# Patient Record
Sex: Male | Born: 1990 | Race: White | Hispanic: No | Marital: Single | Smoking: Former smoker
Health system: Southern US, Community
[De-identification: ages and names within clinical notes are randomized; demographics above are authoritative.]

## PROBLEM LIST (undated history)

## (undated) DIAGNOSIS — S129XXA Fracture of neck, unspecified, initial encounter: Secondary | ICD-10-CM

## (undated) DIAGNOSIS — I422 Other hypertrophic cardiomyopathy: Secondary | ICD-10-CM

## (undated) HISTORY — PX: OTHER SURGICAL HISTORY: SHX169

---
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
## 2010-10-10 LAB — POCT I-STAT CREATININE
Creatinine, Ser: 0.9
Operator id: 288831

## 2010-10-10 LAB — TSH: TSH: 140.408 — ABNORMAL HIGH

## 2010-10-10 LAB — URINALYSIS, ROUTINE W REFLEX MICROSCOPIC
Bilirubin Urine: NEGATIVE
Glucose, UA: NEGATIVE
Hgb urine dipstick: NEGATIVE
Protein, ur: NEGATIVE
Urobilinogen, UA: 0.2

## 2010-10-10 LAB — BASIC METABOLIC PANEL
CO2: 25
CO2: 27
Calcium: 9
Calcium: 9.5
Chloride: 104
Creatinine, Ser: 0.66
Creatinine, Ser: 0.7
Glucose, Bld: 132 — ABNORMAL HIGH
Glucose, Bld: 93
Sodium: 137
Sodium: 138

## 2010-10-10 LAB — I-STAT 8, (EC8 V) (CONVERTED LAB)
Bicarbonate: 22.2
HCT: 40
Hemoglobin: 13.6
Operator id: 288831
Sodium: 138
TCO2: 23
pCO2, Ven: 36.4 — ABNORMAL LOW

## 2010-10-10 LAB — CBC
Hemoglobin: 11.8 — ABNORMAL LOW
Hemoglobin: 12.9
MCHC: 34.3
MCHC: 34.5
MCHC: 34.6
MCV: 81.5
Platelets: 309
RBC: 4.24
RBC: 4.62
RDW: 14.3
RDW: 14.3
WBC: 26 — ABNORMAL HIGH

## 2010-10-10 LAB — T3, FREE: T3, Free: 1.6 — ABNORMAL LOW (ref 2.3–4.2)

## 2010-10-10 LAB — T4, FREE: Free T4: 0.46 — ABNORMAL LOW

## 2010-10-10 LAB — PROTIME-INR
INR: 1
Prothrombin Time: 12.9

## 2014-07-14 ENCOUNTER — Emergency Department (HOSPITAL_COMMUNITY)
Admission: EM | Admit: 2014-07-14 | Discharge: 2014-07-14 | Disposition: A | Discharge status: Home / Self Care | Payer: Self-pay | Attending: Emergency Medicine | Admitting: Emergency Medicine

## 2014-07-14 ENCOUNTER — Encounter (HOSPITAL_COMMUNITY): Payer: Self-pay | Admitting: Emergency Medicine

## 2014-07-14 DIAGNOSIS — R51 Headache: Secondary | ICD-10-CM | POA: Insufficient documentation

## 2014-07-14 DIAGNOSIS — Z87891 Personal history of nicotine dependence: Secondary | ICD-10-CM | POA: Insufficient documentation

## 2014-07-14 DIAGNOSIS — R519 Headache, unspecified: Secondary | ICD-10-CM

## 2014-07-14 DIAGNOSIS — R011 Cardiac murmur, unspecified: Secondary | ICD-10-CM | POA: Insufficient documentation

## 2014-07-14 DIAGNOSIS — H538 Other visual disturbances: Secondary | ICD-10-CM | POA: Insufficient documentation

## 2014-07-14 DIAGNOSIS — Z8781 Personal history of (healed) traumatic fracture: Secondary | ICD-10-CM | POA: Insufficient documentation

## 2014-07-14 HISTORY — DX: Fracture of neck, unspecified, initial encounter: S12.9XXA

## 2014-07-14 MED ORDER — IBUPROFEN 800 MG PO TABS
800.0000 mg | ORAL_TABLET | Freq: Once | ORAL | Status: AC
  Administered 2014-07-14: 800 mg via ORAL
  Filled 2014-07-14: qty 1

## 2014-07-14 NOTE — ED Notes (Signed)
Pt reports that at approx 1230 today he started having "flashing like a strobe" in his R eye which passed but then he developed a headache. Pt denies dizziness or nausea at present. Nausea at initial onset of headache. No changes to speech, no weakness. No neuro deficits noted.

## 2014-07-14 NOTE — ED Provider Notes (Signed)
CSN: [id number]     Arrival date & time 07/14/14  1648 History   First MD Initiated Contact with Patient 07/14/14 1728     Chief Complaint  Patient presents with  . Headache    Patient is a 24 y.o. male presenting with headaches. The history is provided by the patient.  Headache Pain location:  Frontal Quality:  Dull Severity currently:  4/10 Onset quality:  Gradual Duration: several hours. Progression:  Improving Chronicity:  Recurrent Similar to prior headaches: yes   Relieved by:  Acetaminophen Worsened by:  Nothing Associated symptoms: blurred vision and visual change   Associated symptoms: no fever, no neck pain, no syncope, no vomiting and no weakness   Risk factors: no family hx of SAH   Pt reports earlier today he noticed a "flash of strobe of lights" in his vision and soon after this stopped he had onset of frontal HA No visual loss He reports blurred vision during that time now improved No diplopia No focal weakness No cp/sob No dizziness He is now improved No recent head trauma (reports distant h/o trauma due to [facility]) He reports similar episode 2 years ago of this similar HA but no evaluation performed  Past Medical History  Diagnosis Date  . Neck fracture    History reviewed. No pertinent past surgical history. History reviewed. No pertinent family history. History  Substance Use Topics  . Smoking status: Former Games developer  . Smokeless tobacco: Current User  . Alcohol Use: No    Review of Systems  Constitutional: Negative for fever.  Eyes: Positive for blurred vision.  Respiratory: Negative for shortness of breath.   Cardiovascular: Negative for chest pain and syncope.  Gastrointestinal: Negative for vomiting.  Musculoskeletal: Negative for neck pain.  Neurological: Positive for headaches. Negative for syncope, speech difficulty and weakness.  All other systems reviewed and are negative.     Allergies  Codeine  Home Medications   Prior to  Admission medications   Not on File   BP 145/74 mmHg  Pulse 89  Temp(Src) 97.8 F (36.6 C) (Oral)  Resp 16  Ht  (1.575 m)  Wt 217 lb (98.431 kg)  BMI 39.68 kg/m2  SpO2 96% Physical Exam CONSTITUTIONAL: Well developed/well nourished HEAD: Normocephalic/atraumatic EYES: EOMI/PERRL, no nystagmus, no ptosis, normal fundoscopic exam (no papilledema) ENMT: Mucous membranes moist NECK: supple no meningeal signs, no bruits SPINE/BACK:entire spine nontender CV: S1/S2 noted, 2/6 systolic ejection murmur LUNGS: Lungs are clear to auscultation bilaterally, no apparent distress ABDOMEN: soft, nontender, no rebound or guarding GU:no cva tenderness NEURO:Awake/alert, facies symmetric, no arm or leg drift is noted Equal 5/5 strength with shoulder abduction, elbow flex/extension, wrist flex/extension in upper extremities and equal hand grips bilaterally Equal 5/5 strength with hip flexion,knee flex/extension, foot dorsi/plantar flexion Cranial nerves 3/4/5/6/07/24/08/11/12 tested and intact Gait normal without ataxia No past pointing Sensation to light touch intact in all extremities EXTREMITIES: pulses normal, full ROM SKIN: warm, color normal PSYCH: no abnormalities of mood noted, alert and oriented to situation     ED Course  Procedures  6:01 PM Pt reports he is already improved  He is well appearing No neuro deficits I doubt SAH/CVA/carotid dissection at this time, doubt other acute neurologic event at this time  He has significant heart murmur.  He is unaware of this.  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

## 2014-07-14 NOTE — Discharge Instructions (Signed)

## 2016-09-11 ENCOUNTER — Encounter: Payer: Self-pay | Admitting: Cardiology

## 2016-09-11 ENCOUNTER — Ambulatory Visit (INDEPENDENT_AMBULATORY_CARE_PROVIDER_SITE_OTHER): Payer: Self-pay | Admitting: Cardiology

## 2016-09-11 VITALS — BP 124/64 | HR 72 | Resp 12 | Ht 64.0 in | Wt 217.0 lb

## 2016-09-11 DIAGNOSIS — R011 Cardiac murmur, unspecified: Secondary | ICD-10-CM

## 2016-09-11 NOTE — Progress Notes (Signed)
Cardiology Office Note:    Date:  09/11/2016   ID:  Tyler Walton, DOB [birth year], MRN [id number]  PCP:  Estanislado Pandy, MD  Cardiologist:  Garwin Brothers, MD   Referring MD: Estanislado Pandy, MD    ASSESSMENT:    No diagnosis found. PLAN:    In order of problems listed above:  1. I reassured the patient about my findings. He's had asymptomatic life all this time. I discussed him and found him that his never had an echocardiogram in the past. His EKG is abnormal. My first step is an echocardiogram to assess his cardiac anatomy. Further recommendations will be made based on the findings of the echocardiogram. Again he has had a strong family history of hypertrophic cardiomyopathy. His father has had a defibrillator and has undergone alcohol ablation for his hypertrophic cardiomyopathy.Patient will be seen in follow-up appointment in 1 months or earlier if the patient has any concerns.    Medication Adjustments/Labs and Tests Ordered: Current medicines are reviewed at length with the patient today.  Concerns regarding medicines are outlined above.  No orders of the defined types were placed in this encounter.  No orders of the defined types were placed in this encounter.    History of Present Illness:    Tyler Walton is a 26 y.o. male who is being seen today for the evaluation of Cardiac murmur at the request of Sasser, Clarene Critchley, MD. Patient is a pleasant 26 year old male accompanied by his family. He has no significant past medical history. He mentions to me that 2 years ago he was told by his doctor to have a cardiac murmur. He has a significant family history of hypertrophic cardiomyopathy. He denies any chest pain orthopnea or PND. He works as a Sales executive. At the time of my evaluation is alert awake oriented and in no distress. Is no history of palpitation dizziness or any passing out spells.  Past Medical History:  Diagnosis Date  . Neck fracture (HCC)     History  reviewed. No pertinent surgical history.  Current Medications: No outpatient prescriptions have been marked as taking for the 09/11/16 encounter (Office Visit) with Revankar, Aundra Dubin, MD.     Allergies:   Codeine   Social History   Social History  . Marital status: Single    Spouse name: N/A  . Number of children: N/A  . Years of education: N/A   Social History Main Topics  . Smoking status: Former Smoker    Start date: 2016  . Smokeless tobacco: Current User    Types: Snuff  . Alcohol use No  . Drug use: No  . Sexual activity: Not Asked   Other Topics Concern  . None   Social History Narrative  . None     Family History: The patient's family history includes Heart disease in his father.  ROS:   Please see the history of present illness.    All other systems reviewed and are negative.  EKGs/Labs/Other Studies Reviewed:    The following studies were reviewed today: I reviewed the patient's records from primary care office and discuss with him at extensive length. EKG was reviewed also.   Recent Labs: No results found for requested labs within last 8760 hours.  Recent Lipid Panel No results found for: CHOL, TRIG, HDL, CHOLHDL, VLDL, LDLCALC, LDLDIRECT  Physical Exam:    VS:  BP 124/64   Pulse 72   Resp 12   Ht 5\' 4"  (1.626  m)   Wt 217 lb (98.4 kg)   BMI 37.25 kg/m     Wt Readings from Last 3 Encounters:  09/11/16 217 lb (98.4 kg)  07/14/14 217 lb (98.4 kg)     GEN: Patient is in no acute distress HEENT: Normal NECK: No JVD; No carotid bruits LYMPHATICS: No lymphadenopathy CARDIAC: S1 S2 regular, 2/6 systolic murmur at the apex. RESPIRATORY:  Clear to auscultation without rales, wheezing or rhonchi  ABDOMEN: Soft, non-tender, non-distended MUSCULOSKELETAL:  No edema; No deformity  SKIN: Warm and dry NEUROLOGIC:  Alert and oriented x 3 PSYCHIATRIC:  Normal affect    Signed, Garwin Brothers, MD  09/11/2016 11:19 AM    [facility]

## 2016-09-11 NOTE — Patient Instructions (Signed)
Medication Instructions:  Your physician recommends that you continue on your current medications as directed. Please refer to the Current Medication list given to you today.   Labwork: None ordered  Testing/Procedures: Your physician has requested that you have an echocardiogram. Echocardiography is a painless test that uses sound waves to create images of your heart. It provides your doctor with information about the size and shape of your heart and how well your heart's chambers and valves are working. This procedure takes approximately one hour. There are no restrictions for this procedure.  Follow-Up: Your physician recommends that you schedule a follow-up appointment in: 1 month with Dr. Tomie China  Any Other Special Instructions Will Be Listed Below (If Applicable).  Please note that any paperwork needing to be filled out by the provider will need to be addressed at the front desk prior to seeing the provider. Please note that any paperwork FMLA, Disability or other documents regarding health condition is subject to a $25.00 charge that must be received prior to completion of paperwork in the form of a money order or check.     If you need a refill on your cardiac medications before your next appointment, please call your pharmacy.

## 2016-09-14 ENCOUNTER — Ambulatory Visit (HOSPITAL_BASED_OUTPATIENT_CLINIC_OR_DEPARTMENT_OTHER)
Admission: RE | Admit: 2016-09-14 | Discharge: 2016-09-14 | Disposition: A | Discharge status: Home / Self Care | Payer: Self-pay | Source: Ambulatory Visit | Attending: Cardiology | Admitting: Cardiology

## 2016-09-14 DIAGNOSIS — I340 Nonrheumatic mitral (valve) insufficiency: Secondary | ICD-10-CM | POA: Insufficient documentation

## 2016-09-14 DIAGNOSIS — R011 Cardiac murmur, unspecified: Secondary | ICD-10-CM

## 2016-09-14 DIAGNOSIS — I517 Cardiomegaly: Secondary | ICD-10-CM | POA: Insufficient documentation

## 2016-09-14 NOTE — Progress Notes (Signed)
  Echocardiogram 2D Echocardiogram has been performed.  Tyler Walton Tyler Walton 09/14/2016, 1:38 PM

## 2016-09-15 ENCOUNTER — Telehealth: Payer: Self-pay

## 2016-09-15 ENCOUNTER — Other Ambulatory Visit: Payer: Self-pay

## 2016-09-15 DIAGNOSIS — R931 Abnormal findings on diagnostic imaging of heart and coronary circulation: Secondary | ICD-10-CM

## 2016-09-15 NOTE — Telephone Encounter (Signed)
Patient informed of abnormal echo results. Patient advised that a referral to [facility] has been made for a cardiologist who specialized in hypertrophic cardiomyopathy. Patient agreed and verbalized understanding. Patient has been advised to stay out of work until further notice. Patient advised to come to the office to fill out medical records release form to have [facility] Patient is coming Tuesday morning 09/19/16 to pick up work note and fill out medical records release. No further questions.

## 2016-09-26 ENCOUNTER — Ambulatory Visit: Payer: Self-pay | Admitting: Cardiology

## 2016-10-12 ENCOUNTER — Ambulatory Visit: Payer: Self-pay | Admitting: Cardiology

## 2016-10-26 ENCOUNTER — Ambulatory Visit: Payer: Self-pay | Admitting: Cardiology

## 2018-03-24 ENCOUNTER — Emergency Department (HOSPITAL_COMMUNITY): Payer: Self-pay

## 2018-03-24 ENCOUNTER — Encounter (HOSPITAL_COMMUNITY): Payer: Self-pay | Admitting: Emergency Medicine

## 2018-03-24 ENCOUNTER — Emergency Department (HOSPITAL_COMMUNITY)
Admission: EM | Admit: 2018-03-24 | Discharge: 2018-03-24 | Disposition: A | Discharge status: Home / Self Care | Payer: Self-pay | Attending: Emergency Medicine | Admitting: Emergency Medicine

## 2018-03-24 ENCOUNTER — Other Ambulatory Visit: Payer: Self-pay

## 2018-03-24 DIAGNOSIS — Z87891 Personal history of nicotine dependence: Secondary | ICD-10-CM | POA: Insufficient documentation

## 2018-03-24 DIAGNOSIS — I493 Ventricular premature depolarization: Secondary | ICD-10-CM | POA: Insufficient documentation

## 2018-03-24 DIAGNOSIS — R002 Palpitations: Secondary | ICD-10-CM

## 2018-03-24 HISTORY — DX: Other hypertrophic cardiomyopathy: I42.2

## 2018-03-24 LAB — CBC WITH DIFFERENTIAL/PLATELET
ABS IMMATURE GRANULOCYTES: 0.02 10*3/uL (ref 0.00–0.07)
Basophils Absolute: 0 10*3/uL (ref 0.0–0.1)
Basophils Relative: 0 %
EOS ABS: 0.1 10*3/uL (ref 0.0–0.5)
EOS PCT: 2 %
HCT: 45.7 % (ref 39.0–52.0)
HEMOGLOBIN: 14.7 g/dL (ref 13.0–17.0)
Immature Granulocytes: 0 %
LYMPHS PCT: 17 %
Lymphs Abs: 1.6 10*3/uL (ref 0.7–4.0)
MCH: 27.2 pg (ref 26.0–34.0)
MCHC: 32.2 g/dL (ref 30.0–36.0)
MCV: 84.6 fL (ref 80.0–100.0)
Monocytes Absolute: 0.7 10*3/uL (ref 0.1–1.0)
Monocytes Relative: 7 %
NRBC: 0 % (ref 0.0–0.2)
Neutro Abs: 7.1 10*3/uL (ref 1.7–7.7)
Neutrophils Relative %: 74 %
Platelets: 268 10*3/uL (ref 150–400)
RBC: 5.4 MIL/uL (ref 4.22–5.81)
RDW: 12.8 % (ref 11.5–15.5)
WBC: 9.6 10*3/uL (ref 4.0–10.5)

## 2018-03-24 LAB — MAGNESIUM: MAGNESIUM: 2.1 mg/dL (ref 1.7–2.4)

## 2018-03-24 LAB — BASIC METABOLIC PANEL
ANION GAP: 8 (ref 5–15)
BUN: 15 mg/dL (ref 6–20)
CHLORIDE: 104 mmol/L (ref 98–111)
CO2: 26 mmol/L (ref 22–32)
CREATININE: 0.82 mg/dL (ref 0.61–1.24)
Calcium: 9.4 mg/dL (ref 8.9–10.3)
GFR calc non Af Amer: >60 mL/min (ref >60)
Glucose, Bld: 98 mg/dL (ref 70–99)
Potassium: 3.7 mmol/L (ref 3.5–5.1)
SODIUM: 138 mmol/L (ref 135–145)

## 2018-03-24 LAB — TROPONIN I
Troponin I: 0.03 ng/mL (ref <0.03)
Troponin I: 0.03 ng/mL (ref <0.03)

## 2018-03-24 NOTE — Discharge Instructions (Addendum)
Avoid avoid caffinated products, such as teas, colas, coffee, chocolate. Avoid over the counter cold medicines, herbal or "natural vitamin" products, and illicit drugs because they can contain stimulants.  Take your usual medications as previously directed. Call your regular Cardiologist tomorrow to schedule a follow up appointment this week.  Return to the Emergency Department immediately if worsening.

## 2018-03-24 NOTE — ED Triage Notes (Signed)
Patient c/o palpitations that started yesterday and has become more constant. Per patient feels palpitation approx every 5 minutes. Denies any chest pain or shortness of breath. 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

## 2018-03-24 NOTE — ED Provider Notes (Signed)
[facility] EMERGENCY DEPARTMENT Provider Note   CSN: [id number] Arrival date & time: 03/24/18  1501    History   Chief Complaint Chief Complaint  Patient presents with  . Palpitations    HPI Tyler Walton is a 28 y.o. male.      Palpitations  Pt was seen at 1530. Per pt, c/o gradual onset and worsening of persistent palpitations for the past 2 days. Pt describes the palpitations as "irregular beats" and are occurring "every 5 minutes." Endorses hx of hypertrophic cardiomyopathy, currently taking metoprolol daily. Denies CP, no SOB, no cough, no back pain, no abd pain, no fevers, no injury.      Past Medical History:  Diagnosis Date  . Hypertrophic cardiomyopathy (HCC)   . Neck fracture [facility])     Patient Active Problem List   Diagnosis Date Noted  . Murmur 09/11/2016    History reviewed. No pertinent surgical history.      Home Medications    Prior to Admission medications   Not on File    Family History Family History  Problem Relation Age of Onset  . Heart disease Father     Social History Social History   Tobacco Use  . Smoking status: Former Smoker    Types: Cigarettes    Start date: 2016  . Smokeless tobacco: Current User    Types: Snuff  Substance Use Topics  . Alcohol use: No  . Drug use: No     Allergies   Codeine   Review of Systems Review of Systems  Cardiovascular: Positive for palpitations.  ROS: Statement: All systems negative except as marked or noted in the HPI; Constitutional: Negative for fever and chills. ; ; Eyes: Negative for eye pain, redness and discharge. ; ; ENMT: Negative for ear pain, hoarseness, nasal congestion, sinus pressure and sore throat. ; ; Cardiovascular: +palpitations. Negative for chest pain, diaphoresis, dyspnea and peripheral edema. ; ; Respiratory: Negative for cough, wheezing and stridor. ; ; Gastrointestinal: Negative for nausea, vomiting, diarrhea, abdominal pain, blood in stool, hematemesis,  jaundice and rectal bleeding. . ; ; Genitourinary: Negative for dysuria, flank pain and hematuria. ; ; Musculoskeletal: Negative for back pain and neck pain. Negative for swelling and trauma.; ; Skin: Negative for pruritus, rash, abrasions, blisters, bruising and skin lesion.; ; Neuro: Negative for headache, lightheadedness and neck stiffness. Negative for weakness, altered level of consciousness, altered mental status, extremity weakness, paresthesias, involuntary movement, seizure and syncope.        Physical Exam Updated Vital Signs BP 132/74 (BP Location: Right Arm)   Pulse 68   Temp 97.7 F (36.5 C) (Oral)   Resp 18   Ht 5\' 4"  (1.626 m)   Wt 90.7 kg   SpO2 98%   BMI 34.33 kg/m   Patient Vitals for the past 24 hrs:  BP Temp Temp src Pulse Resp SpO2 Height Weight  03/24/18 2030 110/69 - - 60 16 97 % - -  03/24/18 1930 110/67 - - 67 15 96 % - -  03/24/18 1900 99/65 - - (!) 59 15 97 % - -  03/24/18 1830 102/64 - - 82 15 97 % - -  03/24/18 1800 (!) 111/49 - - 67 12 96 % - -  03/24/18 1730 111/72 - - 78 14 94 % - -  03/24/18 1700 118/70 - - 85 16 97 % - -  03/24/18 1630 (!) 102/58 - - 62 16 97 % - -  03/24/18 1600 128/79 - -  68 11 97 % - -  03/24/18 1530 127/66 - - 60 18 96 % - -  03/24/18 1524 - - - - - -  (1.626 m) 90.7 kg  03/24/18 1523 132/74 97.7 F (36.5 C) Oral 68 18 98 % - -      Physical Exam 1535: Physical examination:  Nursing notes reviewed; Vital signs and O2 SAT reviewed;  Constitutional: Well developed, Well nourished, Well hydrated, In no acute distress; Head:  Normocephalic, atraumatic; Eyes: EOMI, PERRL, No scleral icterus; ENMT: Mouth and pharynx normal, Mucous membranes moist; Neck: Supple, Full range of motion, No lymphadenopathy; Cardiovascular: Regular rate and rhythm, No gallop. +PVC's on monitor when pt states he feels the irregular heart beat during my exam.; Respiratory: Breath sounds clear & equal bilaterally, No wheezes.  Speaking full  sentences with ease, Normal respiratory effort/excursion; Chest: Nontender, Movement normal; Abdomen: Soft, Nontender, Nondistended, Normal bowel sounds; Genitourinary: No CVA tenderness; Extremities: Peripheral pulses normal, No tenderness, No edema, No calf edema or asymmetry.; Neuro: AA&Ox3, Major CN grossly intact.  Speech clear. No gross focal motor or sensory deficits in extremities.; Skin: Color normal, Warm, Dry.   ED Treatments / Results  Labs (all labs ordered are listed, but only abnormal results are displayed)   EKG EKG Interpretation  Date/Time:  Sunday March 24 2018 15:19:15 EDT Ventricular Rate:  59 PR Interval:    QRS Duration: 98 QT Interval:  419 QTC Calculation: 415 R Axis:   -5 Text Interpretation:  Sinus rhythm Probable anterior infarct, age indeterminate Nonspecific T wave abnormality Anterolateral leads No old tracing to compare Confirmed by Samuel Jester [phone]) on 03/24/2018 3:49:59 PM    EKG Interpretation  Date/Time:  Sunday March 24 2018 18:12:02 EDT Ventricular Rate:  72 PR Interval:    QRS Duration: 96 QT Interval:  450 QTC Calculation: 493 R Axis:   -4 Text Interpretation:  Sinus rhythm Probable anterior infarct, age indeterminate Prolonged QT interval Since last tracing of earlier today QT has lengthened Otherwise no significant change Confirmed by Samuel Jester [phone]) on 03/24/2018 6:29:18 PM       Radiology   Procedures Procedures (including critical care time)  Medications Ordered in ED Medications - No data to display   Initial Impression / Assessment and Plan / ED Course  I have reviewed the triage vital signs and the nursing notes.  Pertinent labs & imaging results that were available during my care of the patient were reviewed by me and considered in my medical decision making (see chart for details).     MDM Reviewed: previous chart, nursing note and vitals Reviewed previous: labs Interpretation: labs, ECG and  x-ray     Results for orders placed or performed during the hospital encounter of 03/24/18  Basic metabolic panel  Result Value Ref Range   Sodium 138 135 - 145 mmol/L   Potassium 3.7 3.5 - 5.1 mmol/L   Chloride 104 98 - 111 mmol/L   CO2 26 22 - 32 mmol/L   Glucose, Bld 98 70 - 99 mg/dL   BUN 15 6 - 20 mg/dL   Creatinine, Ser 0.98 0.61 - 1.24 mg/dL   Calcium 9.4 8.9 - 11.9 mg/dL   GFR calc non Af Amer >60 >60 mL/min   GFR calc Af Amer >60 >60 mL/min   Anion gap 8 5 - 15  CBC with Differential  Result Value Ref Range   WBC 9.6 4.0 - 10.5 K/uL   RBC 5.40 4.22 - 5.81  MIL/uL   Hemoglobin 14.7 13.0 - 17.0 g/dL   HCT 09.6 28.3 - 66.2 %   MCV 84.6 80.0 - 100.0 fL   MCH 27.2 26.0 - 34.0 pg   MCHC 32.2 30.0 - 36.0 g/dL   RDW 94.7 65.4 - 65.0 %   Platelets 268 150 - 400 K/uL   nRBC 0.0 0.0 - 0.2 %   Neutrophils Relative % 74 %   Neutro Abs 7.1 1.7 - 7.7 K/uL   Lymphocytes Relative 17 %   Lymphs Abs 1.6 0.7 - 4.0 K/uL   Monocytes Relative 7 %   Monocytes Absolute 0.7 0.1 - 1.0 K/uL   Eosinophils Relative 2 %   Eosinophils Absolute 0.1 0.0 - 0.5 K/uL   Basophils Relative 0 %   Basophils Absolute 0.0 0.0 - 0.1 K/uL   Immature Granulocytes 0 %   Abs Immature Granulocytes 0.02 0.00 - 0.07 K/uL  Magnesium  Result Value Ref Range   Magnesium 2.1 1.7 - 2.4 mg/dL  Troponin I - Once  Result Value Ref Range   Troponin I 0.03 (HH) <0.03 ng/mL  Troponin I - Once-Timed  Result Value Ref Range   Troponin I 0.03 (HH) <0.03 ng/mL   Dg Chest 2 View Result Date: 03/24/2018 CLINICAL DATA:  Palpitations. EXAM: CHEST - 2 VIEW COMPARISON:  March 08, 2007 FINDINGS: The heart size and mediastinal contours are within normal limits. Both lungs are clear. The visualized skeletal structures are unremarkable. IMPRESSION: No active cardiopulmonary disease. Electronically Signed   By: Gerome Sam III M.D   On: 03/24/2018 16:27        [facility]   "Progress Notes -  documented in this encounter Nolon Rod, MD - 12/17/2017 12:40 PM EST  Based on the history, exam, and review of diagnostic test results below,  1) Hypertrophic cardiomyopathy (CMS-HCC) [I42.2]   NYHA 2 sx. improved symptoms on beta-blocker therapy. We discussed options for managing symptoms related to HCM and outflow tract obstruction including additional medications and septal reduction therapies. At this time, will assess his functional capacity with exercise stress test but not pursue septal reduction therapy unless symptoms progress.  He does not have extreme hypertrophy, family history of sudden cardiac death or history of syncope. No NSVT on 30-day ambulatory ECG. Will assess his blood pressure response during exercise test. No current indication for primary prevention ICD. I have counseled him and his wife that if either his father or brother have inappropriate ICD discharge for ventricular arrhythmia, this would count as a risk for him and need consideration of primary prevention ICD. His ESC HCM SCD risk is calculated at 3.3% over the next 5 years."  He and his wife are interested in genetic testing and will return for this with genetic counseling. They are in the early stages of considering in vitro fertilization and there is a high likelihood that there is a pathogenic variant that may be identified with genetic testing for his HCM. If so, this may allow for PGD for selecting non-HCM embryos during IVF."    [facility] Cardiology [facility])  Location   "Progress Notes Si Raider, MD - 10/15/2017 3:38 PM EDT Formatting of this note might be different from the original. CC: FUV HCM  Impression: 1. Abnormal EKG ECG 12 lead  2. Hypertrophic cardiomyopathy (*)  3. Family history of hypertrophic cardiomyopathy   Plan:  We discussed ICD for primary prevention. He declines ICD at this time. He hasn't fainted,  had VT. Septum 23mm. No family history of sudden  death, but father had HCM with hx syncope. The risks and benefits of defibrillator implantation were reviewed with the patient. The benefit would be reduced risk of sudden death due to a sudden arrhythmia. The risks include death, stroke, infection, allergic reaction, damage to heart lungs or blood vessels, device malfunction, lead dislodgment, need for device followup, need for periodic battery changes in the future. The fact that sudden death can still occur in the presence of an ICD was discussed. The pros/cons of transvenous vs. Subcutaneous device were also reviewed. He wants to hold off on ICD.  I discussed repeating his echo and if still has outflow obstruction will arrange for consult at [facility] for possible myomectomy given he still has DOE.  FUV with me 6 months.   Si Raider  CC: Sasser, Clarene Critchley, MD   Electronically signed by Si Raider, MD at 10/15/2017 3:47 PM EDT"     2050:  Pt observed on ED monitor for 6 hours; +intermittent PVC's, no sustained arrhythmias. Pt continues to deny CP/SOB. Delta trop flat.  Pt ambulated with HR remaining 65-88 bpm.  T/C returned from [facility] Dr. Chestine Spore on call for Dr. Clovis Riley, case discussed, including:  HPI, pertinent PM/SHx, VS/PE, dx testing, ED course and treatment:  Agrees with ED workup, no change in meds for now, OK to d/c pt and have pt f/u in office this week. Dx and testing, as well as d/w Cards MD, d/w pt and family.  Questions answered.  Verb understanding, agreeable to d/c home with outpt f/u.     Final Clinical Impressions(s) / ED Diagnoses   Final diagnoses:  None    ED Discharge Orders    None       Samuel Jester, DO 03/28/18 2214

## 2018-03-24 NOTE — ED Notes (Signed)
Date and time results received: 03/24/18 2030 (use smartphrase ".now" to insert current time)  Test: trop  Critical Value: 0.03  Name of Provider Notified: manus   Orders Received? Or Actions Taken?: none at this time

## 2018-03-24 NOTE — ED Notes (Signed)
Pt ambulated around nurses station with no assistance, HR stayed between 65-88bpm.

## 2018-03-24 NOTE — ED Notes (Signed)
Date and time results received: 03/24/18 4:50 PM  (use smartphrase ".now" to insert current time)  Test: Troponin Critical Value: 0.03  Name of Provider Notified: Clarene [facility]  Orders Received? Or Actions Taken?: Orders Received - See Orders for details

## 2019-06-20 ENCOUNTER — Emergency Department (HOSPITAL_COMMUNITY): Payer: PRIVATE HEALTH INSURANCE

## 2019-06-20 ENCOUNTER — Emergency Department (HOSPITAL_COMMUNITY)
Admission: EM | Admit: 2019-06-20 | Discharge: 2019-06-20 | Disposition: A | Discharge status: Home / Self Care | Payer: PRIVATE HEALTH INSURANCE | Attending: Emergency Medicine | Admitting: Emergency Medicine

## 2019-06-20 ENCOUNTER — Encounter (HOSPITAL_COMMUNITY): Payer: Self-pay | Admitting: *Deleted

## 2019-06-20 ENCOUNTER — Other Ambulatory Visit: Payer: Self-pay

## 2019-06-20 DIAGNOSIS — Z87891 Personal history of nicotine dependence: Secondary | ICD-10-CM | POA: Insufficient documentation

## 2019-06-20 DIAGNOSIS — Z885 Allergy status to narcotic agent status: Secondary | ICD-10-CM | POA: Insufficient documentation

## 2019-06-20 DIAGNOSIS — Z79899 Other long term (current) drug therapy: Secondary | ICD-10-CM | POA: Insufficient documentation

## 2019-06-20 DIAGNOSIS — Z8679 Personal history of other diseases of the circulatory system: Secondary | ICD-10-CM | POA: Diagnosis not present

## 2019-06-20 DIAGNOSIS — R002 Palpitations: Secondary | ICD-10-CM | POA: Insufficient documentation

## 2019-06-20 LAB — BASIC METABOLIC PANEL
Anion gap: 11 (ref 5–15)
BUN: 18 mg/dL (ref 6–20)
CO2: 26 mmol/L (ref 22–32)
Calcium: 9.5 mg/dL (ref 8.9–10.3)
Chloride: 100 mmol/L (ref 98–111)
Creatinine, Ser: 0.97 mg/dL (ref 0.61–1.24)
GFR calc Af Amer: >60 mL/min (ref >60)
GFR calc non Af Amer: >60 mL/min (ref >60)
Glucose, Bld: 112 mg/dL — ABNORMAL HIGH (ref 70–99)
Potassium: 4.1 mmol/L (ref 3.5–5.1)
Sodium: 137 mmol/L (ref 135–145)

## 2019-06-20 LAB — TROPONIN I (HIGH SENSITIVITY)
Troponin I (High Sensitivity): 157 ng/L (ref <18)
Troponin I (High Sensitivity): 192 ng/L (ref <18)

## 2019-06-20 LAB — CBC
HCT: 48 % (ref 39.0–52.0)
Hemoglobin: 15.7 g/dL (ref 13.0–17.0)
MCH: 27.7 pg (ref 26.0–34.0)
MCHC: 32.7 g/dL (ref 30.0–36.0)
MCV: 84.7 fL (ref 80.0–100.0)
Platelets: 328 10*3/uL (ref 150–400)
RBC: 5.67 MIL/uL (ref 4.22–5.81)
RDW: 13.2 % (ref 11.5–15.5)
WBC: 9.8 10*3/uL (ref 4.0–10.5)
nRBC: 0 % (ref 0.0–0.2)

## 2019-06-20 MED ORDER — SODIUM CHLORIDE 0.9% FLUSH: 3.0000 mL | Freq: Once | INTRAVENOUS | Status: DC

## 2019-06-20 MED ORDER — METOPROLOL TARTRATE 50 MG PO TABS
50.0000 mg | ORAL_TABLET | Freq: Once | ORAL | Status: AC
  Administered 2019-06-20: 50 mg via ORAL
  Filled 2019-06-20: qty 1

## 2019-06-20 NOTE — ED Provider Notes (Signed)
[facility] EMERGENCY DEPARTMENT Provider Note   CSN: [id number] Arrival date & time: 06/20/19  1655     History Chief Complaint  Patient presents with  . Irregular Heart Beat    Tyler Walton is a 29 y.o. male.  Patient has a history of cardiomyopathy.  Patient takes atenolol once a day at night but is supposed to take it twice a day.  Patient states he had some palpitations.  He is supposed to see his cardiologist on Monday no chest pain no shortness of breath no syncope  The history is provided by the patient. No language interpreter was used.  Palpitations Palpitations quality:  Regular Onset quality:  Sudden Timing:  Intermittent Progression:  Waxing and waning Chronicity:  Recurrent Context: not anxiety   Relieved by:  Nothing Worsened by:  Nothing Ineffective treatments:  None tried Associated symptoms: no back pain, no chest pain and no cough        Past Medical History:  Diagnosis Date  . Hypertrophic cardiomyopathy ([place])   . Neck fracture [facility])     Patient Active Problem List   Diagnosis Date Noted  . Murmur 09/11/2016    History reviewed. No pertinent surgical history.     Family History  Problem Relation Age of Onset  . Heart disease Father     Social History   Tobacco Use  . Smoking status: Former Smoker    Types: Cigarettes    Start date: 2016  . Smokeless tobacco: Current User    Types: Snuff  Substance Use Topics  . Alcohol use: No  . Drug use: No    Home Medications Prior to Admission medications   Medication Sig Start Date End Date Taking? Authorizing Provider  levothyroxine (SYNTHROID) 75 MCG tablet Take 75 mcg by mouth daily before breakfast.   Yes [provider]  metoprolol tartrate (LOPRESSOR) 50 MG tablet Take 50 mg by mouth 2 (two) times daily.   Yes [provider]    Allergies    Codeine  Review of Systems   Review of Systems  Constitutional: Negative for appetite change and fatigue.  HENT:  Negative for congestion, ear discharge and sinus pressure.   Eyes: Negative for discharge.  Respiratory: Negative for cough.   Cardiovascular: Positive for palpitations. Negative for chest pain.  Gastrointestinal: Negative for abdominal pain and diarrhea.  Genitourinary: Negative for frequency and hematuria.  Musculoskeletal: Negative for back pain.  Skin: Negative for rash.  Neurological: Negative for seizures and headaches.  Psychiatric/Behavioral: Negative for hallucinations.    Physical Exam Updated Vital Signs BP 101/60   Pulse 66   Temp 98.2 F (36.8 C) (Oral)   Resp (!) 25   Ht 5\' 4"  (1.626 m)   Wt 95.3 kg   SpO2 99%   BMI 36.05 kg/m   Physical Exam Vitals and nursing note reviewed.  Constitutional:      Appearance: He is well-developed.  HENT:     Head: Normocephalic.     Nose: Nose normal.  Eyes:     General: No scleral icterus.    Conjunctiva/sclera: Conjunctivae normal.  Neck:     Thyroid: No thyromegaly.  Cardiovascular:     Rate and Rhythm: Normal rate and regular rhythm.     Heart sounds: No murmur. No friction rub. No gallop.   Pulmonary:     Breath sounds: No stridor. No wheezing or rales.  Chest:     Chest wall: No tenderness.  Abdominal:  General: There is no distension.     Tenderness: There is no abdominal tenderness. There is no rebound.  Musculoskeletal:        General: Normal range of motion.     Cervical back: Neck supple.  Lymphadenopathy:     Cervical: No cervical adenopathy.  Skin:    Findings: No erythema or rash.  Neurological:     Mental Status: He is alert and oriented to person, place, and time.     Motor: No abnormal muscle tone.     Coordination: Coordination normal.  Psychiatric:        Behavior: Behavior normal.     ED Results / Procedures / Treatments   Labs (all labs ordered are listed, but only abnormal results are displayed) Labs Reviewed  BASIC METABOLIC PANEL - Abnormal; Notable for the following  components:      Result Value   Glucose, Bld 112 (*)    All other components within normal limits  TROPONIN I (HIGH SENSITIVITY) - Abnormal; Notable for the following components:   Troponin I (High Sensitivity) 157 (*)    All other components within normal limits  TROPONIN I (HIGH SENSITIVITY) - Abnormal; Notable for the following components:   Troponin I (High Sensitivity) 192 (*)    All other components within normal limits  CBC    EKG EKG Interpretation  Date/Time:  Friday June 20 2019 17:11:26 EDT Ventricular Rate:  68 PR Interval:  144 QRS Duration: 76 QT Interval:  414 QTC Calculation: 440 R Axis:   -3 Text Interpretation: Normal sinus rhythm with sinus arrhythmia Left ventricular hypertrophy with repolarization abnormality ( R in aVL ) Abnormal ECG Confirmed by Bethann Berkshire [phone]) on 06/20/2019 8:10:12 PM Also confirmed by Bethann Berkshire [phone])  on 06/20/2019 8:25:22 PM   Radiology DG Chest 2 View  Result Date: 06/20/2019 CLINICAL DATA:  Rapid heart rate EXAM: CHEST - 2 VIEW COMPARISON:  03/24/2018 FINDINGS: The heart size and mediastinal contours are within normal limits. Both lungs are clear. The visualized skeletal structures are unremarkable. IMPRESSION: Negative. Electronically Signed   By: Charlett Nose M.D.   On: 06/20/2019 18:00    Procedures Procedures (including critical care time)  Medications Ordered in ED Medications  sodium chloride flush (NS) 0.9 % injection 3 mL (3 mLs Intravenous Not Given 06/20/19 1919)  metoprolol tartrate (LOPRESSOR) tablet 50 mg (50 mg Oral Given 06/20/19 2045)    ED Course  I have reviewed the triage vital signs and the nursing notes.  Pertinent labs & imaging results that were available during my care of the patient were reviewed by me and considered in my medical decision making (see chart for details).  CRITICAL CARE Performed by: Bethann Berkshire Total critical care time:40 minutes Critical care time was exclusive of separately  billable procedures and treating other patients. Critical care was necessary to treat or prevent imminent or life-threatening deterioration. Critical care was time spent personally by me on the following activities: development of treatment plan with patient and/or surrogate as well as nursing, discussions with consultants, evaluation of patient's response to treatment, examination of patient, obtaining history from patient or surrogate, ordering and performing treatments and interventions, ordering and review of laboratory studies, ordering and review of radiographic studies, pulse oximetry and re-evaluation of patient's condition.  MDM Rules/Calculators/A&P                      Patient with palpitations that have improved.  Patient has mild elevation  of troponin.  I spoke with Dr. Mayford Knife who is the partner of the patient's cardiologist and he stated the patient could go home and make sure he takes metoprolol twice a day and follow-up Monday with his doctor.  If he has any problems return sooner           This patient presents to the ED for concern of palpitations this involves an extensive number of treatment options, and is a complaint that carries with it a high risk of complications and morbidity.  The differential diagnosis includes PVCs   Lab Tests:   I Ordered, reviewed, and interpreted labs, which included CBC chemistries troponins which show an elevated troponin  Medicines ordered:  I ordered medication metoprolol palpitations Imaging Studies ordered:   I ordered imaging studies which included chest x-ray and  I independently visualized and interpreted imaging which showed no acute disease  Additional history obtained:   Additional history obtained from spouse  Previous records obtained and reviewed   Consultations Obtained:   I consulted the patient's cardiologist and discussed lab and imaging findings  Reevaluation:  After the interventions stated above, I  reevaluated the patient and found improved  Critical Interventions:  .   Final Clinical Impression(s) / ED Diagnoses Final diagnoses:  Palpitations    Rx / DC Orders ED Discharge Orders    None       Bethann Berkshire, MD 06/23/19 1138

## 2019-06-20 NOTE — ED Notes (Signed)
Date and time results received: 06/20/19 2053  Test: Troponin Critical Value: 192  Name of Provider Notified: Zammit  Orders Received? Or Actions Taken?: acknowledged

## 2019-06-20 NOTE — ED Notes (Signed)
Date and time results received: 06/20/19 at 1852  (use smartphrase ".now" to insert current time)  Test: troponin Critical Value: 157  Name of Provider Notified: ZAMMIT  Orders Received? Or Actions Taken- evaluation

## 2019-06-20 NOTE — Discharge Instructions (Addendum)
Make sure you take your metoprolol twice a day and follow-up Monday as planned.  If you have any problems return to the emergency department

## 2019-06-20 NOTE — ED Triage Notes (Signed)
C/o heart skipping beats frequently.

## 2021-04-18 ENCOUNTER — Emergency Department (HOSPITAL_COMMUNITY)
Admission: EM | Admit: 2021-04-18 | Discharge: 2021-04-18 | Disposition: A | Discharge status: Home / Self Care | Payer: PRIVATE HEALTH INSURANCE | Attending: Emergency Medicine | Admitting: Emergency Medicine

## 2021-04-18 ENCOUNTER — Other Ambulatory Visit: Payer: Self-pay

## 2021-04-18 ENCOUNTER — Emergency Department (HOSPITAL_COMMUNITY): Payer: PRIVATE HEALTH INSURANCE

## 2021-04-18 ENCOUNTER — Encounter (HOSPITAL_COMMUNITY): Payer: Self-pay | Admitting: Emergency Medicine

## 2021-04-18 DIAGNOSIS — R002 Palpitations: Secondary | ICD-10-CM | POA: Insufficient documentation

## 2021-04-18 DIAGNOSIS — J189 Pneumonia, unspecified organism: Secondary | ICD-10-CM | POA: Insufficient documentation

## 2021-04-18 DIAGNOSIS — R000 Tachycardia, unspecified: Secondary | ICD-10-CM | POA: Insufficient documentation

## 2021-04-18 DIAGNOSIS — R059 Cough, unspecified: Secondary | ICD-10-CM | POA: Diagnosis present

## 2021-04-18 DIAGNOSIS — Z20822 Contact with and (suspected) exposure to covid-19: Secondary | ICD-10-CM | POA: Diagnosis not present

## 2021-04-18 LAB — BASIC METABOLIC PANEL
Anion gap: 10 (ref 5–15)
BUN: 15 mg/dL (ref 6–20)
CO2: 26 mmol/L (ref 22–32)
Calcium: 9.1 mg/dL (ref 8.9–10.3)
Chloride: 101 mmol/L (ref 98–111)
Creatinine, Ser: 0.87 mg/dL (ref 0.61–1.24)
GFR, Estimated: >60 mL/min (ref >60)
Glucose, Bld: 98 mg/dL (ref 70–99)
Potassium: 4.2 mmol/L (ref 3.5–5.1)
Sodium: 137 mmol/L (ref 135–145)

## 2021-04-18 LAB — CBC WITH DIFFERENTIAL/PLATELET
Abs Immature Granulocytes: 0.02 10*3/uL (ref 0.00–0.07)
Basophils Absolute: 0 10*3/uL (ref 0.0–0.1)
Basophils Relative: 0 %
Eosinophils Absolute: 0.1 10*3/uL (ref 0.0–0.5)
Eosinophils Relative: 2 %
HCT: 46.7 % (ref 39.0–52.0)
Hemoglobin: 15 g/dL (ref 13.0–17.0)
Immature Granulocytes: 0 %
Lymphocytes Relative: 12 %
Lymphs Abs: 0.9 10*3/uL (ref 0.7–4.0)
MCH: 26.9 pg (ref 26.0–34.0)
MCHC: 32.1 g/dL (ref 30.0–36.0)
MCV: 83.7 fL (ref 80.0–100.0)
Monocytes Absolute: 1 10*3/uL (ref 0.1–1.0)
Monocytes Relative: 14 %
Neutro Abs: 5.1 10*3/uL (ref 1.7–7.7)
Neutrophils Relative %: 72 %
Platelets: 238 10*3/uL (ref 150–400)
RBC: 5.58 MIL/uL (ref 4.22–5.81)
RDW: 13.2 % (ref 11.5–15.5)
WBC: 7.1 10*3/uL (ref 4.0–10.5)
nRBC: 0 % (ref 0.0–0.2)

## 2021-04-18 LAB — RESP PANEL BY RT-PCR (FLU A&B, COVID) ARPGX2
Influenza A by PCR: NEGATIVE
Influenza B by PCR: NEGATIVE
SARS Coronavirus 2 by RT PCR: NEGATIVE

## 2021-04-18 LAB — MAGNESIUM: Magnesium: 2 mg/dL (ref 1.7–2.4)

## 2021-04-18 MED ORDER — SODIUM CHLORIDE 0.9 % IV BOLUS
1000.0000 mL | Freq: Once | INTRAVENOUS | Status: AC
  Administered 2021-04-18: 1000 mL via INTRAVENOUS

## 2021-04-18 MED ORDER — AZITHROMYCIN 250 MG PO TABS: 250.0000 mg | ORAL_TABLET | Freq: Every day | ORAL | 0 refills | Status: AC

## 2021-04-18 NOTE — Discharge Instructions (Signed)
If you develop high fever, coughing up blood, shortness of breath, chest pain, vomiting, dizziness, lightheadedness, passing out, or any other new/concerning symptoms then return to the ER for evaluation. ?

## 2021-04-18 NOTE — ED Triage Notes (Signed)
Pt to the ED with cough , congestion, and heart palpitations with a history of cardiomyopathy. ? ?

## 2021-04-18 NOTE — ED Provider Notes (Signed)
?[facility] EMERGENC[facility] DEPARTMENT ?Provider Note ? ? ?CSN: [id number] ?Arrival date & time: 04/18/21  0623 ? ?  ? ?History ? ?Chief Complaint  ?Patient presents with  ? Palpitations  ? ? ?Tyler Walton is a 31 y.o. male. ? ?HPI ?31 year old male presents with palpitations and cough. Symptoms started 3 days ago. Has had a fever, cough, congestion. Has had on and off palpitations, which he gets with his history of cardiomyopathy. No chest pain or dyspnea. Palpitations come and go. No leg swelling.  ? ?Home Medications ?Prior to Admission medications   ?Medication Sig Start Date End Date Taking? Authorizing Provider  ?acetaminophen (TYLENOL) 325 MG tablet Take 650 mg by mouth every 6 (six) hours as needed for mild pain.   Yes [provider]  ?azithromycin (ZITHROMAX) 250 MG tablet Take 1 tablet (250 mg total) by mouth daily. Take first 2 tablets together, then 1 every day until finished. 04/18/21  Yes Pricilla Loveless, MD  ?metoprolol succinate (TOPROL-XL) 50 MG 24 hr tablet Take 50 mg by mouth daily. 04/02/21  Yes [provider]  ?   ? ?Allergies    ?Codeine   ? ?Review of Systems   ?Review of Systems  ?Constitutional:  Positive for fever.  ?HENT:  Positive for congestion.   ?Respiratory:  Positive for cough. Negative for shortness of breath.   ?Cardiovascular:  Positive for palpitations. Negative for chest pain and leg swelling.  ? ?Physical Exam ?Updated Vital Signs ?BP 114/62   Pulse 97   Temp 98 ?F (36.7 ?C)   Resp 20   Ht 5\' 4"  (1.626 m)   Wt 97.5 kg   SpO2 97%   BMI 36.90 kg/m?  ?Physical Exam ?Vitals and nursing note reviewed.  ?Constitutional:   ?   Appearance: He is well-developed. He is obese.  ?HENT:  ?   Head: Normocephalic and atraumatic.  ?Cardiovascular:  ?   Rate and Rhythm: Regular rhythm. Tachycardia present.  ?   Heart sounds: Normal heart sounds.  ?Pulmonary:  ?   Effort: Pulmonary effort is normal.  ?   Breath sounds: Normal breath sounds. No wheezing, rhonchi or rales.   ?Abdominal:  ?   General: There is no distension.  ?Musculoskeletal:  ?   Right lower leg: No edema.  ?   Left lower leg: No edema.  ?Skin: ?   General: Skin is warm and dry.  ?Neurological:  ?   Mental Status: He is alert.  ? ? ?ED Results / Procedures / Treatments   ?Labs ?(all labs ordered are listed, but only abnormal results are displayed) ?Labs Reviewed  ?RESP PANEL BY RT-PCR (FLU A&B, COVID) [id number]  ?BASIC METABOLIC PANEL  ?CBC WITH DIFFERENTIAL/PLATELET  ?MAGNESIUM  ? ? ?EKG ?EKG Interpretation ? ?Date/Time:  Monday April 18 2021 11:09:30 EDT ?Ventricular Rate:  95 ?PR Interval:  147 ?QRS Duration: 86 ?QT Interval:  337 ?QTC Calculation: 424 ?R Axis:   30 ?Text Interpretation: Sinus rhythm LAE, consider biatrial enlargement LVH with secondary repolarization abnormality ST changes similar to June 2021 Confirmed by July 2021 [phone]) on 04/18/2021 11:11:49 AM ? ?Radiology ?DG Chest 2 View ? ?Result Date: 04/18/2021 ?CLINICAL DATA:  Cough since Friday. EXAM: CHEST - 2 VIEW COMPARISON:  June 20, 2019 FINDINGS: The heart size and mediastinal contours are within normal limits. Perihilar bronchial wall cuffing and interstitial opacities. No focal airspace consolidation. No pleural effusion. No pneumothorax. The visualized skeletal structures are unremarkable. IMPRESSION: Perihilar bronchial wall cuffing  and interstitial opacities, may reflect bronchitis or atypical infection. Electronically Signed   By: Maudry Mayhew M.D.   On: 04/18/2021 11:14   ? ?Procedures ?Procedures  ? ? ?Medications Ordered in ED ?Medications  ?sodium chloride 0.9 % bolus 1,000 mL (0 mLs Intravenous Stopped 04/18/21 1209)  ? ? ?ED Course/ Medical Decision Making/ A&P ?  ?                        ?Medical Decision Making ?Amount and/or Complexity of Data Reviewed ?Labs: ordered. ?Radiology: ordered. ? ?Risk ?Prescription drug management. ? ? ?Previous chart reviewed and he has a history of hypertrophic cardiomyopathy that is followed by  cardiology.  Patient's x-ray shows questionable pneumonia on my interpretation.  No lobar pneumonia but with his URI-like symptoms no other obvious source such as COVID or flu I think is reasonable to treat with azithromycin.  Otherwise, he has not had any palpitations while he is been here and his cardiac monitoring shows no arrhythmia.  Labs are normal.  ECG without acute ischemia and appears similar to prior.  No chest pain or shortness of breath to make this sound like a cardiac cause.  At this point, he appears stable for discharge home with supportive care and antibiotics.  Given return precautions. ? ? ? ? ? ? ? ?Final Clinical Impression(s) / ED Diagnoses ?Final diagnoses:  ?Atypical pneumonia  ? ? ?Rx / DC Orders ?ED Discharge Orders   ? ?      Ordered  ?  azithromycin (ZITHROMAX) 250 MG tablet  Daily       ? 04/18/21 1231  ? ?  ?  ? ?  ? ? ?  ?Pricilla Loveless, MD ?04/18/21 1235 ? ?

## 2022-04-02 ENCOUNTER — Emergency Department (HOSPITAL_COMMUNITY): Payer: PRIVATE HEALTH INSURANCE

## 2022-04-02 ENCOUNTER — Emergency Department (HOSPITAL_COMMUNITY)
Admission: EM | Admit: 2022-04-02 | Discharge: 2022-04-02 | Disposition: A | Discharge status: Home / Self Care | Payer: PRIVATE HEALTH INSURANCE | Attending: Emergency Medicine | Admitting: Emergency Medicine

## 2022-04-02 ENCOUNTER — Encounter (HOSPITAL_COMMUNITY): Payer: Self-pay | Admitting: *Deleted

## 2022-04-02 ENCOUNTER — Other Ambulatory Visit: Payer: Self-pay

## 2022-04-02 DIAGNOSIS — K1120 Sialoadenitis, unspecified: Secondary | ICD-10-CM | POA: Insufficient documentation

## 2022-04-02 DIAGNOSIS — R000 Tachycardia, unspecified: Secondary | ICD-10-CM | POA: Insufficient documentation

## 2022-04-02 DIAGNOSIS — R6884 Jaw pain: Secondary | ICD-10-CM | POA: Diagnosis present

## 2022-04-02 LAB — BASIC METABOLIC PANEL
Anion gap: 12 (ref 5–15)
BUN: 12 mg/dL (ref 6–20)
CO2: 23 mmol/L (ref 22–32)
Calcium: 9 mg/dL (ref 8.9–10.3)
Chloride: 101 mmol/L (ref 98–111)
Creatinine, Ser: 0.94 mg/dL (ref 0.61–1.24)
GFR, Estimated: >60 mL/min (ref >60)
Glucose, Bld: 109 mg/dL — ABNORMAL HIGH (ref 70–99)
Potassium: 4.1 mmol/L (ref 3.5–5.1)
Sodium: 136 mmol/L (ref 135–145)

## 2022-04-02 LAB — CBC
HCT: 45.1 % (ref 39.0–52.0)
Hemoglobin: 14.9 g/dL (ref 13.0–17.0)
MCH: 28.2 pg (ref 26.0–34.0)
MCHC: 33 g/dL (ref 30.0–36.0)
MCV: 85.4 fL (ref 80.0–100.0)
Platelets: 287 10*3/uL (ref 150–400)
RBC: 5.28 MIL/uL (ref 4.22–5.81)
RDW: 14 % (ref 11.5–15.5)
WBC: 18.8 10*3/uL — ABNORMAL HIGH (ref 4.0–10.5)
nRBC: 0 % (ref 0.0–0.2)

## 2022-04-02 MED ORDER — KETOROLAC TROMETHAMINE 30 MG/ML IJ SOLN
30.0000 mg | Freq: Once | INTRAMUSCULAR | Status: DC
  Filled 2022-04-02: qty 1

## 2022-04-02 MED ORDER — IOHEXOL 300 MG/ML  SOLN
75.0000 mL | Freq: Once | INTRAMUSCULAR | Status: AC | PRN
  Administered 2022-04-02: 75 mL via INTRAVENOUS

## 2022-04-02 MED ORDER — AMOXICILLIN-POT CLAVULANATE 875-125 MG PO TABS
1.0000 | ORAL_TABLET | Freq: Once | ORAL | Status: AC
  Administered 2022-04-02: 1 via ORAL
  Filled 2022-04-02: qty 1

## 2022-04-02 MED ORDER — KETOROLAC TROMETHAMINE 30 MG/ML IJ SOLN: 30.0000 mg | Freq: Once | INTRAMUSCULAR | Status: DC

## 2022-04-02 MED ORDER — AMOXICILLIN-POT CLAVULANATE 875-125 MG PO TABS: 1.0000 | ORAL_TABLET | Freq: Two times a day (BID) | ORAL | 0 refills | Status: AC

## 2022-04-02 NOTE — ED Provider Notes (Signed)
[facility] Provider Note   CSN: [id number] Arrival date & time: 04/02/22  [id number]     History  Chief Complaint  Patient presents with   Jaw Pain   HPI Tyler Walton is a 32 y.o. male presenting for jaw pain.  Started on Friday noticed some pain in the upper right jaw near the base of his ear.  The pain increased over the weekend, started swelling on Saturday.  This morning the swelling and pain was much worse which prompted to come in today.  States he can still open his mouth but it is painful.  Denies trouble swallowing, drooling or trouble breathing.  Denies fever and chills.  Denies trauma to his jaw.  HPI     Home Medications Prior to Admission medications   Medication Sig Start Date End Date Taking? Authorizing Provider  acetaminophen (TYLENOL) 325 MG tablet Take 650 mg by mouth every 6 (six) hours as needed for mild pain.    [provider]  azithromycin (ZITHROMAX) 250 MG tablet Take 1 tablet (250 mg total) by mouth daily. Take first 2 tablets together, then 1 every day until finished. 04/18/21   Sherwood Gambler, MD  metoprolol succinate (TOPROL-XL) 50 MG 24 hr tablet Take 50 mg by mouth daily. 04/02/21   [provider]      Allergies    Codeine    Review of Systems   See HPI for pertinent positives   Physical Exam   Vitals:   04/02/22 0945  BP: 123/87  Pulse: (!) 112  Resp: (!) 24  Temp: 99.8 F (37.7 C)  SpO2: 96%    CONSTITUTIONAL:  well-appearing, NAD NEURO:  Alert and oriented x 3, CN 3-12 grossly intact EYES:  eyes equal and reactive ENT/NECK: Face is asymmetric with noted swelling in the right upper jaw, no fluctuance, tender to touch.  Mouth without notable dental fractures, gingiva appears normal without swelling or redness, no dental fractures noted. CARDIO:  tachycardic and regular rhythm, appears well-perfused  PULM:  No respiratory distress, CTAB GI/GU:  non-distended,  soft MSK/SPINE:  No gross deformities, no edema, moves all extremities  SKIN:  no rash, atraumatic   *Additional and/or pertinent findings included in MDM below    ED Results / Procedures / Treatments   Labs (all labs ordered are listed, but only abnormal results are displayed) Labs Reviewed  CBC - Abnormal; Notable for the following components:      Result Value   WBC 18.8 (*)    All other components within normal limits  BASIC METABOLIC PANEL - Abnormal; Notable for the following components:   Glucose, Bld 109 (*)    All other components within normal limits    EKG None  Radiology CT Soft Tissue Neck W Contrast  Result Date: 04/02/2022 CLINICAL DATA:  Soft tissue infection suspected.  Right jaw pain. EXAM: CT NECK WITH CONTRAST TECHNIQUE: Multidetector CT imaging of the neck was performed using the standard protocol following the bolus administration of intravenous contrast. RADIATION DOSE REDUCTION: This exam was performed according to the departmental dose-optimization program which includes automated exposure control, adjustment of the mA and/or kV according to patient size and/or use of iterative reconstruction technique. CONTRAST:  48mL OMNIPAQUE IOHEXOL 300 MG/ML  SOLN COMPARISON:  03/09/2007 CTA of the neck. FINDINGS: Pharynx and larynx: Normal. No mass or swelling. Salivary glands: Enlargement of the right parotid gland which is hyperdense with adjacent fat stranding and masseteric edema. No  abscess. No calcification. Thyroid: Normal. Lymph nodes: None enlarged or abnormal density. Vascular: Unremarkable Limited intracranial: Negative. Visualized orbits: Negative Mastoids and visualized paranasal sinuses: Clear Skeleton: Unremarkable Upper chest: Clear apical lungs IMPRESSION: Right parotiditis without abscess or stone. Electronically Signed   By: Jorje Guild M.D.   On: 04/02/2022 11:43    Procedures Procedures    Medications Ordered in ED Medications  ketorolac  (TORADOL) 30 MG/ML injection 30 mg (0 mg Intramuscular Hold 04/02/22 1044)  amoxicillin-clavulanate (AUGMENTIN) 875-125 MG per tablet 1 tablet (has no administration in time range)  iohexol (OMNIPAQUE) 300 MG/ML solution 75 mL (75 mLs Intravenous Contrast Given 04/02/22 1127)    ED Course/ Medical Decision Making/ A&P                             Medical Decision Making Amount and/or Complexity of Data Reviewed Labs: ordered. Radiology: ordered.  Risk Prescription drug management.   32 year old male who is well-appearing presenting for jaw pain.  Exam notable for obvious swelling in the right jaw.  CT scan findings consistent with parotiditis.  Treated with Augmentin.  Treated pain with Toradol.  Sent Augmentin to his pharmacy.  Encouraged patient to eat sour candies at home.  Advised to follow-up with ENT.  Discussed return precautions.  Little to no suspicion for respiratory distress at this time.  Vital stable at discharge.        Final Clinical Impression(s) / ED Diagnoses Final diagnoses:  Parotiditis    Rx / DC Orders ED Discharge Orders     None         Harriet Pho, PA-C 04/02/22 1231    Fredia Sorrow, MD 04/04/22 1345

## 2022-04-02 NOTE — ED Triage Notes (Signed)
Pt c/o right jaw pain that started as a stiff jaw on Friday; pt states the pain and swelling have gotten worse  Pt has significant swelling to right jaw

## 2022-04-02 NOTE — Discharge Instructions (Addendum)
Evaluation today notable for parotiditis which is inflammation of the parotid gland.  This is likely causing her symptoms.  The treatment is antibiotics and sour candies.  I have sent your antibiotic which is Augmentin to your pharmacy.  Also recommend that you eat sour candies like lemon drops to promote salivation which will help to alleviate the infection in the gland.  I also would like you for you to follow-up with ear nose and throat for reevaluation in 3 to 4 days.

## 2022-12-27 ENCOUNTER — Emergency Department (HOSPITAL_COMMUNITY)
Admission: EM | Admit: 2022-12-27 | Discharge: 2022-12-27 | Disposition: A | Discharge status: Home / Self Care | Payer: Self-pay | Attending: Emergency Medicine | Admitting: Emergency Medicine

## 2022-12-27 ENCOUNTER — Encounter (HOSPITAL_COMMUNITY): Payer: Self-pay

## 2022-12-27 ENCOUNTER — Emergency Department (HOSPITAL_COMMUNITY): Payer: Self-pay

## 2022-12-27 DIAGNOSIS — Z20822 Contact with and (suspected) exposure to covid-19: Secondary | ICD-10-CM | POA: Insufficient documentation

## 2022-12-27 DIAGNOSIS — R051 Acute cough: Secondary | ICD-10-CM | POA: Insufficient documentation

## 2022-12-27 DIAGNOSIS — Z87891 Personal history of nicotine dependence: Secondary | ICD-10-CM | POA: Insufficient documentation

## 2022-12-27 DIAGNOSIS — B974 Respiratory syncytial virus as the cause of diseases classified elsewhere: Secondary | ICD-10-CM | POA: Insufficient documentation

## 2022-12-27 DIAGNOSIS — R002 Palpitations: Secondary | ICD-10-CM | POA: Insufficient documentation

## 2022-12-27 DIAGNOSIS — B338 Other specified viral diseases: Secondary | ICD-10-CM

## 2022-12-27 LAB — CBC WITH DIFFERENTIAL/PLATELET
Abs Immature Granulocytes: 0.02 10*3/uL (ref 0.00–0.07)
Basophils Absolute: 0 10*3/uL (ref 0.0–0.1)
Basophils Relative: 1 %
Eosinophils Absolute: 0.2 10*3/uL (ref 0.0–0.5)
Eosinophils Relative: 4 %
HCT: 45.3 % (ref 39.0–52.0)
Hemoglobin: 15 g/dL (ref 13.0–17.0)
Immature Granulocytes: 0 %
Lymphocytes Relative: 25 %
Lymphs Abs: 1.6 10*3/uL (ref 0.7–4.0)
MCH: 28.2 pg (ref 26.0–34.0)
MCHC: 33.1 g/dL (ref 30.0–36.0)
MCV: 85.3 fL (ref 80.0–100.0)
Monocytes Absolute: 0.5 10*3/uL (ref 0.1–1.0)
Monocytes Relative: 8 %
Neutro Abs: 3.8 10*3/uL (ref 1.7–7.7)
Neutrophils Relative %: 62 %
Platelets: 245 10*3/uL (ref 150–400)
RBC: 5.31 MIL/uL (ref 4.22–5.81)
RDW: 13.4 % (ref 11.5–15.5)
WBC: 6.2 10*3/uL (ref 4.0–10.5)
nRBC: 0 % (ref 0.0–0.2)

## 2022-12-27 LAB — BASIC METABOLIC PANEL
Anion gap: 10 (ref 5–15)
BUN: 15 mg/dL (ref 6–20)
CO2: 26 mmol/L (ref 22–32)
Calcium: 9.6 mg/dL (ref 8.9–10.3)
Chloride: 103 mmol/L (ref 98–111)
Creatinine, Ser: 0.89 mg/dL (ref 0.61–1.24)
GFR, Estimated: >60 mL/min (ref >60)
Glucose, Bld: 112 mg/dL — ABNORMAL HIGH (ref 70–99)
Potassium: 4.7 mmol/L (ref 3.5–5.1)
Sodium: 139 mmol/L (ref 135–145)

## 2022-12-27 LAB — RESP PANEL BY RT-PCR (RSV, FLU A&B, COVID)  RVPGX2
Influenza A by PCR: NEGATIVE
Influenza B by PCR: NEGATIVE
Resp Syncytial Virus by PCR: POSITIVE — AB
SARS Coronavirus 2 by RT PCR: NEGATIVE

## 2022-12-27 NOTE — ED Triage Notes (Signed)
Pt c/o heart palpitations x2 days and URI x 1 week.  Pt reports he has been taking prednisone and an antibiotic w/o relief.  Hx of hypertrophic cardiomyopathy.

## 2022-12-27 NOTE — ED Provider Notes (Addendum)
[facility] EMERGENCY DEPARTMENT AT [facility] Provider Note   CSN: [id number] Arrival date & time: 12/27/22  4098     History  Chief Complaint  Patient presents with   Palpitations   URI    Tyler Walton is a 32 y.o. male.  Patient followed by cardiology at [facility].  Patient states that has been having heart palpitations for 2 days had an upper respiratory infection for a week.  Has been taking prednisone and antibiotic for that without relief.  Patient has a history of hypertrophic cardiomyopathy that is why he is followed by cards.  Patient is mostly bothered by the feeling of skipped heartbeats.  Will clarify whether patient is still taking Toprol-XL.  Patient seen by [place] back in April for atypical pneumonia.  Patient denies any chest pain.  Felt lightheaded but has not had any syncope.  Past medical history just significant really for the hypertrophic cardiomyopathy.  Patient is a former smoker no longer uses any tobacco products.  Patient's only been on the prednisone for 2 or 3 days.  Patient denies any vomiting or diarrhea.  The cough is been quite persistent and very aggravating.  Patient not using any over-the-counter cold or flu medicines.  The cough is nonproductive.       Home Medications Prior to Admission medications   Medication Sig Start Date End Date Taking? Authorizing Provider  metoprolol succinate (TOPROL-XL) 50 MG 24 hr tablet Take 50 mg by mouth daily. 04/02/21  Yes [provider]  acetaminophen (TYLENOL) 325 MG tablet Take 650 mg by mouth every 6 (six) hours as needed for mild pain.    [provider]  amoxicillin-clavulanate (AUGMENTIN) 875-125 MG tablet Take 1 tablet by mouth every 12 (twelve) hours. 04/02/22   Gareth Eagle, PA-C  azithromycin (ZITHROMAX) 250 MG tablet Take 1 tablet (250 mg total) by mouth daily. Take first 2 tablets together, then 1 every day until finished. Patient not taking: Reported on 12/27/2022 04/18/21    Pricilla Loveless, MD      Allergies    Codeine    Review of Systems   Review of Systems  Constitutional:  Negative for chills and fever.  HENT:  Negative for rhinorrhea and sore throat.   Eyes:  Negative for visual disturbance.  Respiratory:  Positive for cough. Negative for shortness of breath.   Cardiovascular:  Positive for palpitations. Negative for chest pain and leg swelling.  Gastrointestinal:  Negative for abdominal pain, diarrhea, nausea and vomiting.  Genitourinary:  Negative for dysuria.  Musculoskeletal:  Negative for back pain and neck pain.  Skin:  Negative for rash.  Neurological:  Negative for dizziness, light-headedness and headaches.  Hematological:  Does not bruise/bleed easily.  Psychiatric/Behavioral:  Negative for confusion.     Physical Exam Updated Vital Signs BP 111/70   Pulse 80   Temp 97.7 F (36.5 C) (Oral)   Resp 20   Ht 1.626 m (5\' 4" )   Wt 99.8 kg   SpO2 94%   BMI 37.76 kg/m  Physical Exam Vitals and nursing note reviewed.  Constitutional:      General: He is not in acute distress.    Appearance: Normal appearance. He is well-developed.  HENT:     Head: Normocephalic and atraumatic.  Eyes:     Extraocular Movements: Extraocular movements intact.     Conjunctiva/sclera: Conjunctivae normal.     Pupils: Pupils are equal, round, and reactive to light.  Cardiovascular:  Rate and Rhythm: Normal rate. Rhythm irregular.     Heart sounds: No murmur heard. Pulmonary:     Effort: Pulmonary effort is normal. No respiratory distress.     Breath sounds: Normal breath sounds. No wheezing, rhonchi or rales.  Abdominal:     Palpations: Abdomen is soft.     Tenderness: There is no abdominal tenderness.  Musculoskeletal:        General: No swelling.     Cervical back: Neck supple. No rigidity.     Right lower leg: No edema.     Left lower leg: No edema.  Skin:    General: Skin is warm and dry.     Capillary Refill: Capillary refill takes  less than 2 seconds.  Neurological:     General: No focal deficit present.     Mental Status: He is alert and oriented to person, place, and time.  Psychiatric:        Mood and Affect: Mood normal.     ED Results / Procedures / Treatments   Labs (all labs ordered are listed, but only abnormal results are displayed) Labs Reviewed  RESP PANEL BY RT-PCR (RSV, FLU A&B, COVID)  [id number] - Abnormal; Notable for the following components:      Result Value   Resp Syncytial Virus by PCR POSITIVE (*)    All other components within normal limits  BASIC METABOLIC PANEL - Abnormal; Notable for the following components:   Glucose, Bld 112 (*)    All other components within normal limits  CBC WITH DIFFERENTIAL/PLATELET    EKG EKG Interpretation Date/Time:  Wednesday December 27 2022 08:05:06 EST Ventricular Rate:  84 PR Interval:  150 QRS Duration:  90 QT Interval:  394 QTC Calculation: 466 R Axis:   38  Text Interpretation: Sinus rhythm Probable left atrial enlargement Probable LVH with secondary repol abnrm ST depr, consider ischemia, inferior leads No significant change since last tracing Confirmed by Vanetta Mulders [phone]) on 12/27/2022 8:45:08 AM  Radiology DG Chest 2 View  Result Date: 12/27/2022 CLINICAL DATA:  palpitations EXAM: CHEST - 2 VIEW COMPARISON:  Chest XR, 04/18/2021 and 06/20/2019. FINDINGS: Cardiomediastinal silhouette is within normal limits. Lungs are well inflated. No focal consolidation or mass. No pleural effusion or pneumothorax. No acute displaced fracture. IMPRESSION: No acute cardiopulmonary process. Electronically Signed   By: Roanna Banning M.D.   On: 12/27/2022 08:35    Procedures Procedures    Medications Ordered in ED Medications - No data to display  ED Course/ Medical Decision Making/ A&P                                 Medical Decision Making Amount and/or Complexity of Data Reviewed Labs: ordered. Radiology: ordered.   EKG today the  twelve-lead without any arrhythmia.  And appears unchanged from previous.  The cardiac monitor shows a sinus arrhythmia.  Which is probably responsible for his symptoms.  Patient also has fairly significant cough.  Which is nonproductive.  Respiratory panel pending.  Chest x-ray however shows no evidence of pneumonia which is reassuring.  Will check CBC and basic metabolic panel.  Patient has follow-up next week with his cardiologist at [facility].  Patient's respiratory panel positive for RSV.  This would explain a lot of his symptoms.  CBC white count normal basic metabolic panel without any significant electrolyte abnormalities and renal function is normal.  And as we stated chest  x-ray negative for any acute cardiopulmonary process.  Patient stable for discharge home we will continue his prednisone.  No wheezing so I do not think albuterol inhaler will be helpful in stool a little bit worried about it stimulating his heart too much.  Patient's concern for arrhythmia or palpitations seems to be a sinus arrhythmia maybe with some PVCs.  But nothing dangerous about that.   Final Clinical Impression(s) / ED Diagnoses Final diagnoses:  Palpitations  Acute cough  RSV (respiratory syncytial virus infection)    Rx / DC Orders ED Discharge Orders     None         Vanetta Mulders, MD 12/27/22 1006    Vanetta Mulders, MD 12/27/22 1056

## 2022-12-27 NOTE — ED Notes (Signed)
Patient transported to X-ray 

## 2022-12-27 NOTE — Discharge Instructions (Signed)
Would recommend contacting your cardiologist in [place] for early follow-up due to the sinus arrhythmia and also possible premature ventricular contractions.  Return for any new or worse symptoms.  Continue the prednisone.  You were positive for RSV virus today.  That would explain a lot of your symptoms.  Work note provided.

## 2022-12-27 NOTE — ED Notes (Signed)
Pt verbalized understanding of discharge instructions. Opportunity for questions provided. Work note provided.  

## 2023-12-28 IMAGING — DX DG CHEST 2V
2 series · 2 of 2 positions shown · non-contrast
Comparison: June 20, 2019

CLINICAL DATA: Cough since [REDACTED].

EXAM:
CHEST - 2 VIEW

[chest pa]
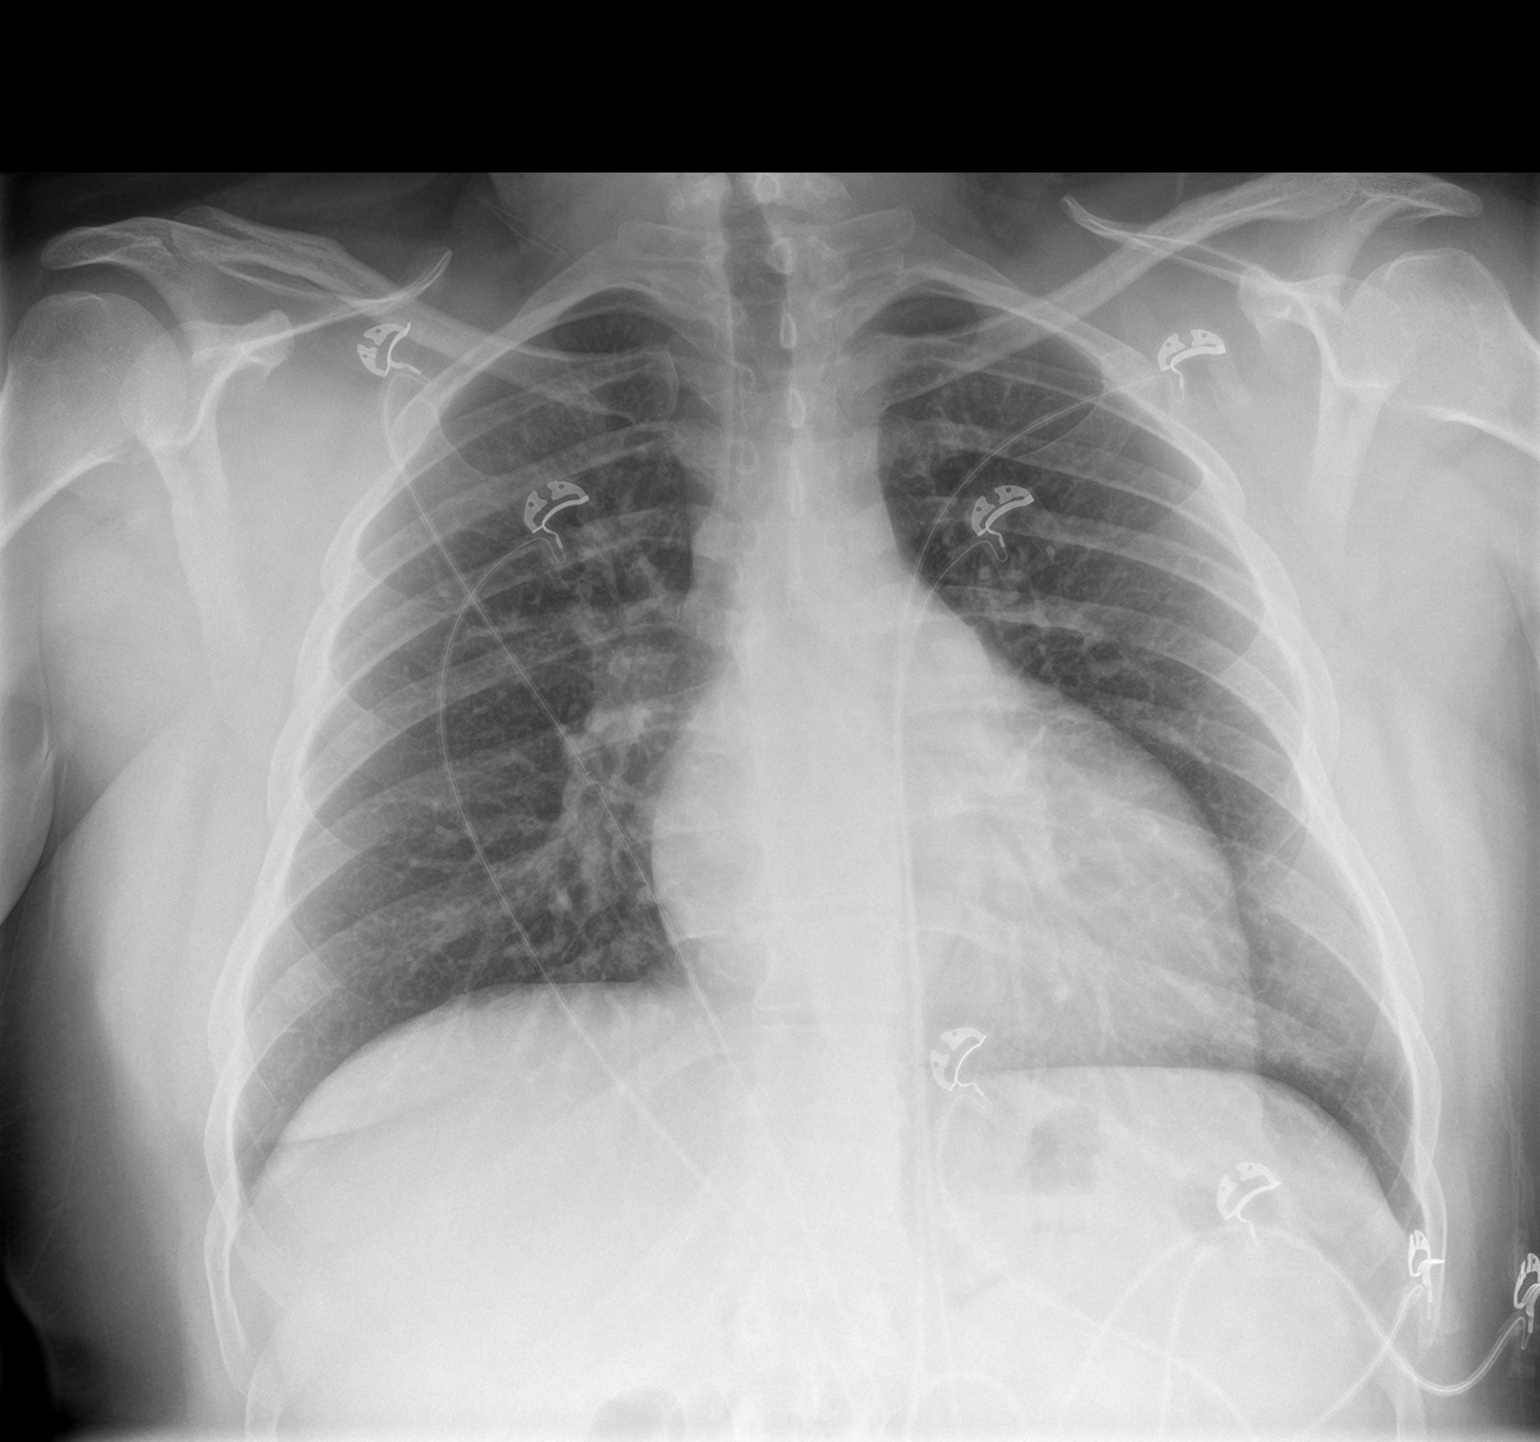

[chest lat]
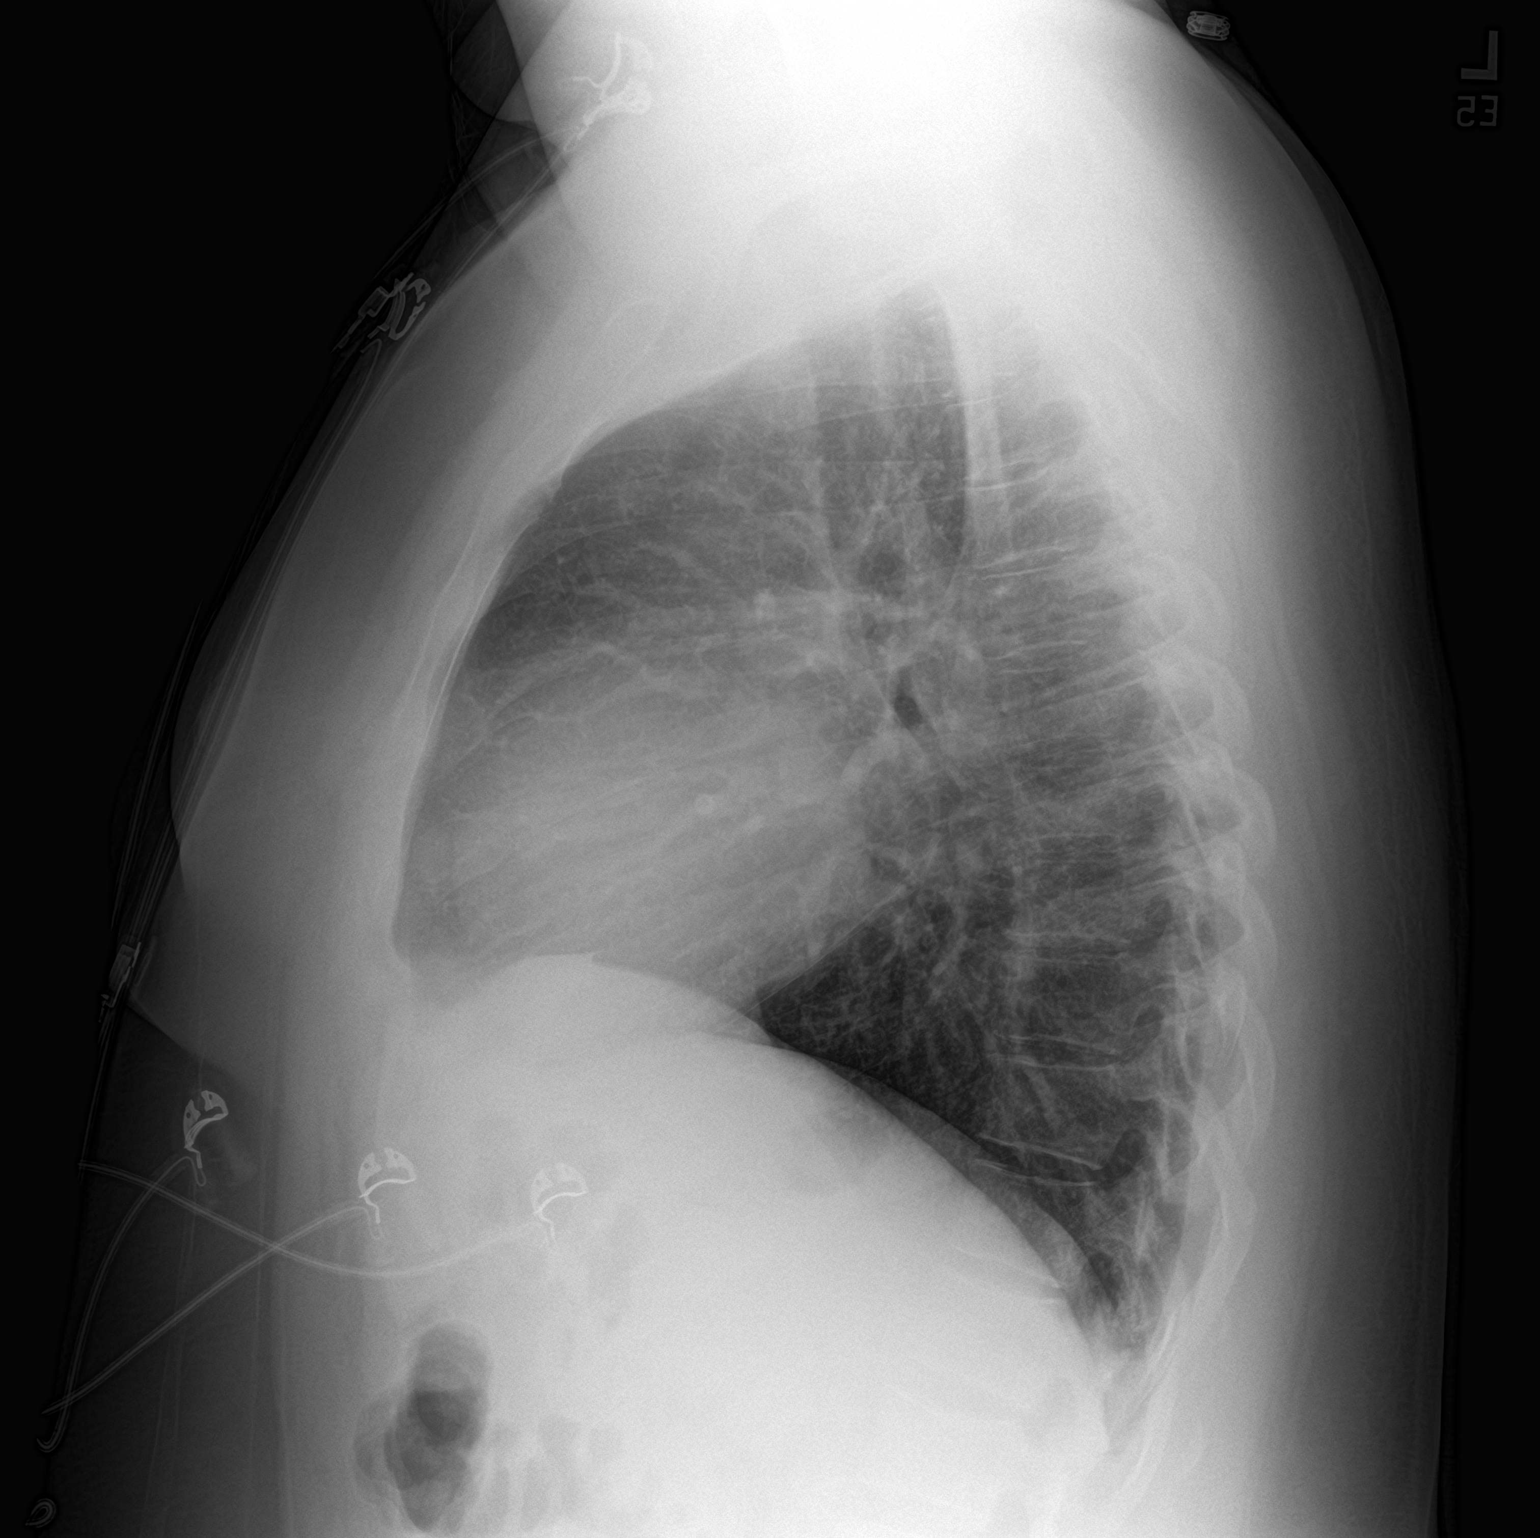

[2 of 2 positions shown; findings below may reference images not displayed]

FINDINGS: The heart size and mediastinal contours are within normal limits.
Perihilar bronchial wall cuffing and interstitial opacities. No
focal airspace consolidation. No pleural effusion. No pneumothorax.
The visualized skeletal structures are unremarkable.
IMPRESSION: Perihilar bronchial wall cuffing and interstitial opacities, may
reflect bronchitis or atypical infection.
